# Patient Record
Sex: Male | Born: 1965 | Race: White | Hispanic: No | Marital: Married | Smoking: Current some day smoker
Health system: Southern US, Community
[De-identification: ages and names within clinical notes are randomized; demographics above are authoritative.]

---

## 2003-04-02 ENCOUNTER — Inpatient Hospital Stay (HOSPITAL_COMMUNITY): Admission: EM | Admit: 2003-04-02 | Discharge: 2003-04-06 | Payer: Self-pay | Admitting: Emergency Medicine

## 2003-04-02 ENCOUNTER — Encounter: Payer: Self-pay | Admitting: Emergency Medicine

## 2017-01-15 ENCOUNTER — Ambulatory Visit (INDEPENDENT_AMBULATORY_CARE_PROVIDER_SITE_OTHER): Payer: Self-pay | Admitting: Family Medicine

## 2017-01-15 VITALS — BP 126/82 | HR 92 | Temp 98.1°F | Resp 18 | Wt 143.0 lb

## 2017-01-15 DIAGNOSIS — Z024 Encounter for examination for driving license: Secondary | ICD-10-CM

## 2017-01-15 NOTE — Progress Notes (Signed)
Commercial Driver Medical Examination   Lawrence Howard is a 51 y.o. male who presents today for a commercial driver fitness determination physical exam. The patient reports no problems. Tobacco use x 10-15 years at 1 ppd, started trying to cut down started 12/03/16 so down to 3 cigs/d. The following portions of the patient's history were reviewed and updated as appropriate: allergies, current medications, past family history, past medical history, past social history, past surgical history and problem list. Review of Systems A comprehensive review of systems was negative.   Objective:    Vision:   Visual Acuity Screening   Right eye Left eye Both eyes  Without correction: 20/25 20/25 20/25   With correction:     Comments: The patient can distinguish the colors red, amber and green. Titmus test was 85 degrees in both eyes. Applicant can recognize and distinguish among traffic control signals and devices showing standard red, green, and amber colors.     Monocular Vision?: No   Hearing:  Hearing Screening Comments: The patient was able to hear a forced whisper from 10 feet.      BP 126/82 (BP Location: Right Arm, Patient Position: Sitting, Cuff Size: Small)   Pulse 92   Temp 98.1 F (36.7 C) (Oral)   Resp 18   Wt 143 lb (64.9 kg)   SpO2 100%   General Appearance:    Alert, cooperative, no distress, appears stated age  Head:    Normocephalic, without obvious abnormality, atraumatic  Eyes:    PERRL, conjunctiva/corneas clear, EOM's intact, fundi    benign, both eyes  Ears:    Normal TM's and external ear canals, both ears  Nose:   Nares normal, septum midline, mucosa normal, no drainage    or sinus tenderness  Throat:   Lips, mucosa, and tongue normal; teeth and gums normal  Neck:   Supple, symmetrical, trachea midline, no adenopathy;    thyroid:  no enlargement/tenderness/nodules; no carotid   bruit or JVD  Back:     Symmetric, no curvature, ROM normal, no CVA tenderness   Lungs:     Clear to auscultation bilaterally, respirations unlabored  Chest Wall:    No tenderness or deformity   Heart:    Regular rate and rhythm, S1 and S2 normal, no murmur, rub   or gallop     Abdomen:     Soft, non-tender, bowel sounds active all four quadrants,    no masses, no organomegaly        Extremities:   Extremities normal, atraumatic, no cyanosis or edema  Pulses:   2+ and symmetric all extremities  Skin:   Skin color, texture, turgor normal, no rashes or lesions  Lymph nodes:   Cervical, supraclavicular nodes normal  Neurologic:   CNII-XII intact, normal strength, sensation and reflexes    throughout    Labs: No results found for: SPECGRAV, PROTEINUR, BILIRUBINUR, GLUCOSEU SG 1.0005, neg blood, neg prot, neg sugar  Assessment:    Healthy male exam.  Meets standards in 6549 CFR 391.41;  qualifies for 2 year certificate.    Plan:    Medical examiners certificate completed and printed. Return as needed.

## 2017-01-15 NOTE — Patient Instructions (Signed)
     IF you received an x-ray today, you will receive an invoice from [facility]. Please contact [facility] at [phone] with questions or concerns regarding your invoice.   IF you received labwork today, you will receive an invoice from [facility]. Please contact [facility] at [phone] with questions or concerns regarding your invoice.   Our billing staff will not be able to assist you with questions regarding bills from these companies.  You will be contacted with the lab results as soon as they are available. The fastest way to get your results is to activate your My Chart account. Instructions are located on the last page of this paperwork. If you have not heard from us regarding the results in 2 weeks, please contact this office.     

## 2017-01-15 NOTE — Progress Notes (Signed)
FINA
